# Patient Record
Sex: Male | Born: 1965 | Race: White | Hispanic: No | Marital: Single | Smoking: Never smoker
Health system: Southern US, Community
[De-identification: ages and names within clinical notes are randomized; demographics above are authoritative.]

---

## 2010-05-07 ENCOUNTER — Emergency Department (HOSPITAL_COMMUNITY): Admission: EM | Admit: 2010-05-07 | Discharge: 2010-05-07 | Payer: Self-pay | Admitting: Emergency Medicine

## 2010-05-14 ENCOUNTER — Emergency Department (HOSPITAL_COMMUNITY): Admission: EM | Admit: 2010-05-14 | Discharge: 2010-05-14 | Payer: Self-pay | Admitting: Emergency Medicine

## 2010-10-27 LAB — BASIC METABOLIC PANEL
CO2: 22 mEq/L (ref 19–32)
GFR calc Af Amer: >60 mL/min (ref >60)
Glucose, Bld: 143 mg/dL — ABNORMAL HIGH (ref 70–99)
Potassium: 3.1 mEq/L — ABNORMAL LOW (ref 3.5–5.1)
Sodium: 138 mEq/L (ref 135–145)

## 2010-10-27 LAB — DIFFERENTIAL
Basophils Relative: 1 % (ref 0–1)
Eosinophils Absolute: 0.2 10*3/uL (ref 0.0–0.7)
Lymphs Abs: 3 10*3/uL (ref 0.7–4.0)
Monocytes Absolute: 0.6 10*3/uL (ref 0.1–1.0)
Monocytes Relative: 6 % (ref 3–12)
Neutro Abs: 6.5 10*3/uL (ref 1.7–7.7)
Neutrophils Relative %: 62 % (ref 43–77)

## 2010-10-27 LAB — PROTIME-INR: INR: 1.03 (ref 0.00–1.49)

## 2010-10-27 LAB — CBC
HCT: 44.9 % (ref 39.0–52.0)
Hemoglobin: 15.7 g/dL (ref 13.0–17.0)
MCH: 31.4 pg (ref 26.0–34.0)
MCHC: 35 g/dL (ref 30.0–36.0)
RBC: 5 MIL/uL (ref 4.22–5.81)
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
## 2011-03-10 IMAGING — CR DG CHEST 2V
2 series · 2 of 2 positions shown · non-contrast
Comparison: 05/07/2010

CLINICAL DATA: Right-sided rib pain

CHEST - 2 VIEW

[w chest pa]
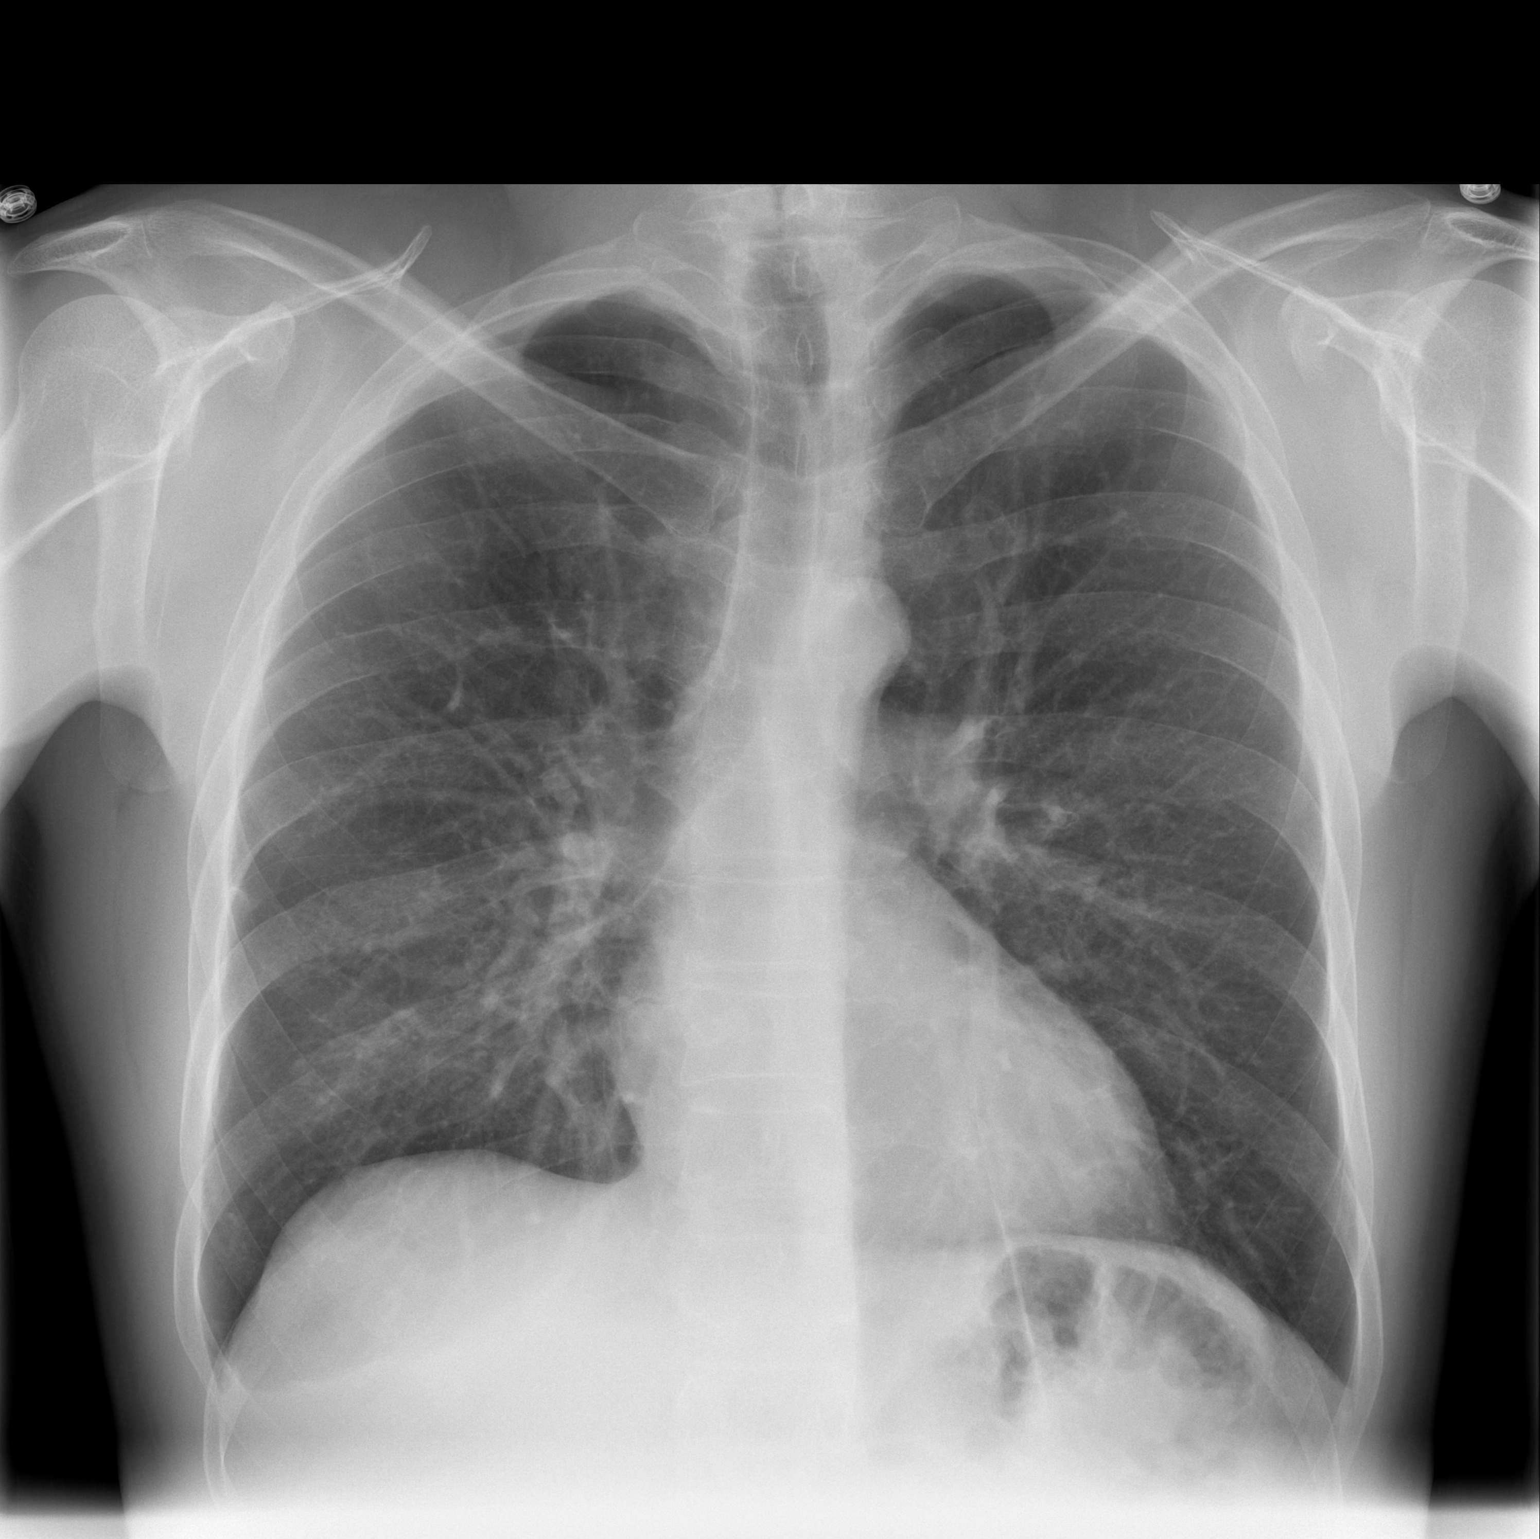

[w chest lat]
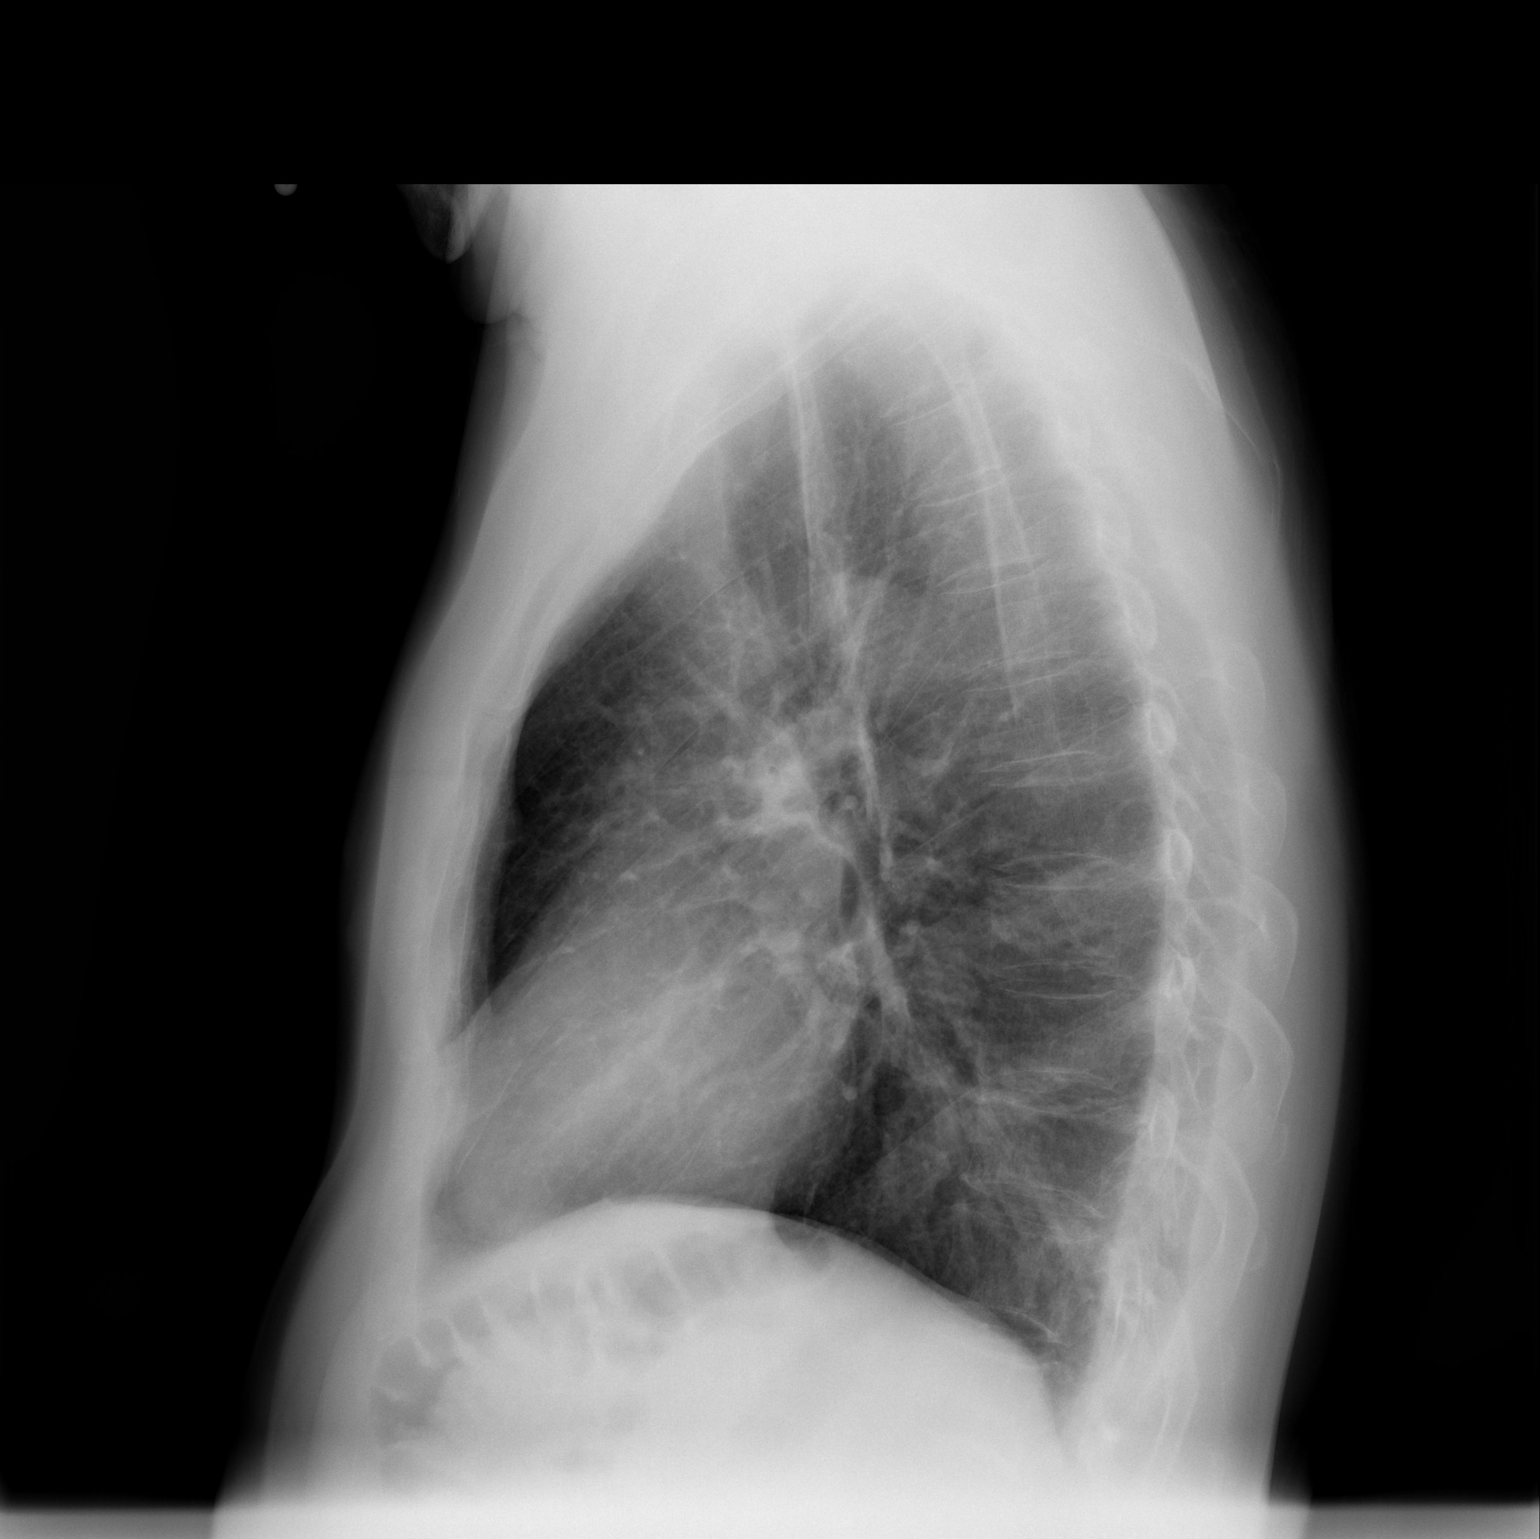

[2 of 2 positions shown; findings below may reference images not displayed]

FINDINGS: The lungs are clear bilaterally.  No confluent airspace
opacities, pleural effuions or pneumothoracies are seen.  The heart
is normal in size and contour.  The upper abdomen is normal.  There
does appear to be healing posterior lateral right fifth through
seventh rib fractures.
IMPRESSION: Healing posterior lateral right rib fractures detailed above.

## 2012-06-10 DIAGNOSIS — K219 Gastro-esophageal reflux disease without esophagitis: Secondary | ICD-10-CM | POA: Insufficient documentation

## 2016-07-27 ENCOUNTER — Ambulatory Visit (INDEPENDENT_AMBULATORY_CARE_PROVIDER_SITE_OTHER): Payer: Medicaid Other | Admitting: Psychiatry

## 2016-07-27 ENCOUNTER — Encounter (HOSPITAL_COMMUNITY): Payer: Self-pay | Admitting: Psychiatry

## 2016-07-27 VITALS — BP 124/70 | HR 73 | Resp 16 | Ht 73.0 in | Wt 202.0 lb

## 2016-07-27 DIAGNOSIS — F411 Generalized anxiety disorder: Secondary | ICD-10-CM | POA: Diagnosis not present

## 2016-07-27 DIAGNOSIS — F410 Panic disorder [episodic paroxysmal anxiety] without agoraphobia: Secondary | ICD-10-CM | POA: Diagnosis not present

## 2016-07-27 DIAGNOSIS — Z79899 Other long term (current) drug therapy: Secondary | ICD-10-CM

## 2016-07-27 MED ORDER — ALPRAZOLAM 0.5 MG PO TABS: ORAL_TABLET | ORAL | 0 refills | Status: AC

## 2016-07-27 MED ORDER — HYDROXYZINE PAMOATE 25 MG PO CAPS: 25.0000 mg | ORAL_CAPSULE | Freq: Every day | ORAL | 0 refills | Status: AC | PRN
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
## 2016-08-22 ENCOUNTER — Ambulatory Visit (HOSPITAL_COMMUNITY): Payer: Self-pay | Admitting: Psychiatry

## 2016-08-24 ENCOUNTER — Other Ambulatory Visit (HOSPITAL_COMMUNITY): Payer: Self-pay | Admitting: Psychiatry

## 2016-09-24 NOTE — Telephone Encounter (Signed)
Medication refill- received fax from [facility] requesting a refill for Vistaril. Per Dr. Gilmore LarocheAkhtar, refill request is denied. Pt will need to schedule an apt. Lvm for pt to contact office.
